# Patient Record
Sex: Female | Born: 1945 | Race: White | Hispanic: No | Marital: Married | Smoking: Never smoker
Health system: Southern US, Community
[De-identification: ages and names within clinical notes are randomized; demographics above are authoritative.]

## PROBLEM LIST (undated history)

## (undated) DIAGNOSIS — F329 Major depressive disorder, single episode, unspecified: Secondary | ICD-10-CM

## (undated) DIAGNOSIS — K219 Gastro-esophageal reflux disease without esophagitis: Secondary | ICD-10-CM

## (undated) DIAGNOSIS — F32A Depression, unspecified: Secondary | ICD-10-CM

## (undated) DIAGNOSIS — M8580 Other specified disorders of bone density and structure, unspecified site: Secondary | ICD-10-CM

## (undated) DIAGNOSIS — N289 Disorder of kidney and ureter, unspecified: Secondary | ICD-10-CM

## (undated) HISTORY — DX: Disorder of kidney and ureter, unspecified: N28.9

## (undated) HISTORY — DX: Major depressive disorder, single episode, unspecified: F32.9

## (undated) HISTORY — PX: ABDOMINAL HYSTERECTOMY: SHX81

## (undated) HISTORY — DX: Gastro-esophageal reflux disease without esophagitis: K21.9

## (undated) HISTORY — DX: Other specified disorders of bone density and structure, unspecified site: M85.80

## (undated) HISTORY — DX: Depression, unspecified: F32.A

---

## 2004-09-08 ENCOUNTER — Ambulatory Visit (HOSPITAL_COMMUNITY): Admission: RE | Admit: 2004-09-08 | Discharge: 2004-09-08 | Payer: Self-pay | Admitting: Gastroenterology

## 2011-09-29 ENCOUNTER — Other Ambulatory Visit (HOSPITAL_COMMUNITY)
Admission: RE | Admit: 2011-09-29 | Discharge: 2011-09-29 | Disposition: A | Discharge status: Home / Self Care | Payer: BC Managed Care – PPO | Source: Ambulatory Visit | Attending: Family Medicine | Admitting: Family Medicine

## 2011-09-29 DIAGNOSIS — Z01419 Encounter for gynecological examination (general) (routine) without abnormal findings: Secondary | ICD-10-CM | POA: Insufficient documentation

## 2012-08-21 ENCOUNTER — Ambulatory Visit (INDEPENDENT_AMBULATORY_CARE_PROVIDER_SITE_OTHER): Payer: BC Managed Care – PPO | Admitting: Internal Medicine

## 2012-08-21 VITALS — BP 102/70 | HR 79 | Temp 98.2°F | Resp 18 | Ht 62.0 in | Wt 160.0 lb

## 2012-08-21 DIAGNOSIS — J9801 Acute bronchospasm: Secondary | ICD-10-CM

## 2012-08-21 DIAGNOSIS — J40 Bronchitis, not specified as acute or chronic: Secondary | ICD-10-CM

## 2012-08-21 MED ORDER — ALBUTEROL SULFATE HFA 108 (90 BASE) MCG/ACT IN AERS: 2.0000 | INHALATION_SPRAY | Freq: Four times a day (QID) | RESPIRATORY_TRACT | Status: AC | PRN

## 2012-08-21 MED ORDER — AZITHROMYCIN 250 MG PO TABS: ORAL_TABLET | ORAL | Status: AC

## 2012-08-21 NOTE — Patient Instructions (Addendum)
Bronchospasm, Adult Bronchospasm means that there is a spasm or tightening of the airways going into the lungs. Because the airways go into a spasm and get smaller it makes breathing more difficult. For reasons not completely known, workings (functions) of the airways designed to protect the lungs become over active. This causes the airways to become more sensitive to:  Infection.  Weather.  Exercise.  Irritants.  Things that cause allergic reactions or allergies (allergens). Frequent coughing or respiratory episodes should be checked for the cause. This condition may be made worse by exercise. CAUSES  Inflammation is often the cause of this condition. Allergy, viral respiratory infections, or irritants in the air often cause this problem. Allergic reactions produce immediate and delayed responses. Late reactions may produce more serious inflammation. This may lead to increased reactivity of the airways. Sometimes this is inherited. Some common triggers are:  Allergies.  Infection commonly triggers attacks. Antibiotics are not helpful for viral infections and usually do not help with attacks of bronchospasm.  Exercise (running, etc.) can trigger an attack. Proper pre-exercise medications help most individuals participate in sports. Swimming is the least likely sport to cause problems.  Irritants (for example, pollution, cigarette smoke, strong odors, aerosol sprays, paint fumes, etc.) may trigger attacks. You cannot smoke and do not allow smoking in your home. This is absolutely necessary. Show this instruction to mates, relatives and significant others that may not agree with you.  Weather changes may cause lung problems but moving around trying to find an ideal climate does not seem to be overly helpful. Winds increase molds and pollens in the air. Rain refreshes the air by washing irritants out. Cold air may cause irritation.  Emotional problems do not cause lung problems but can  trigger attacks. SYMPTOMS  Wheezing is the most common symptom. Frequent coughing (with or without exercise and or crying) and repeated respiratory infections are all early warning signs of bronchospasm. Chest tightness and shortness of breath are other symptoms. DIAGNOSIS  Early hidden bronchospasm may go for long periods of time without being detected. This is especially true if wheezing cannot be detected by your caregiver. Lung (pulmonary) function studies may help with diagnosis in these cases. HOME CARE INSTRUCTIONS   It is necessary to remain calm during an attack. Try to relax and breathe more slowly. During this time medications may be given. If any breathing problems seem to be getting worse and are unresponsive to treatment seek immediate medical care.  If you have severe breathing difficulty or have had a life threatening attack it is probably a good idea for you to learn how to give adrenaline (epi-pen) or use an anaphylaxis kit. Your caregiver can help you with this. These are the same kits carried by people who have severe allergic reactions. This is especially important if you do not have readily accessible medical care.  With any severe breathing problems where epinephrine (adrenaline) has been given at home call 911 immediately as the delayed reaction may be even more severe. SEEK MEDICAL CARE IF:   There is wheezing and shortness of breath, even if medications are given to prevent attacks.  An oral temperature above 102 F (38.9 C) develops.  There are muscle aches, chest pain, or thickening of sputum.  The sputum changes from clear or white to yellow, green, gray, or bloody.  There are problems that may be related to the medicine you are given, such as a rash, itching, swelling, or trouble breathing. [place]  IF:   The usual medicines do not stop your wheezing, or there is increased coughing.  You have increased difficulty breathing. MAKE SURE YOU:     Understand these instructions.  Will watch your condition.  Will get help right away if you are not doing well or get worse. Document Released: 08/25/2003 Document Revised: 11/14/2011 Document Reviewed: 04/09/2008 [facility] Patient Information 2013 [place], [place].

## 2012-08-21 NOTE — Progress Notes (Signed)
  Subjective:    Patient ID: Diana Dickerson, female    DOB: March 08, 1946, 66 y.o.   MRN: [id number]  HPI Cough , hoarseness, wheezes. Has had this before this time of year. No sob, cp   Review of Systems     Objective:   Physical Exam Lungs upper airway wheezes No resp distress       Assessment & Plan:  Zpk/albuterol

## 2014-12-16 DIAGNOSIS — Z803 Family history of malignant neoplasm of breast: Secondary | ICD-10-CM | POA: Diagnosis not present

## 2014-12-16 DIAGNOSIS — Z1231 Encounter for screening mammogram for malignant neoplasm of breast: Secondary | ICD-10-CM | POA: Diagnosis not present

## 2015-05-06 DIAGNOSIS — K219 Gastro-esophageal reflux disease without esophagitis: Secondary | ICD-10-CM | POA: Diagnosis not present

## 2015-05-06 DIAGNOSIS — Z13220 Encounter for screening for lipoid disorders: Secondary | ICD-10-CM | POA: Diagnosis not present

## 2015-05-06 DIAGNOSIS — M859 Disorder of bone density and structure, unspecified: Secondary | ICD-10-CM | POA: Diagnosis not present

## 2015-05-06 DIAGNOSIS — F322 Major depressive disorder, single episode, severe without psychotic features: Secondary | ICD-10-CM | POA: Diagnosis not present

## 2015-05-06 DIAGNOSIS — Z0000 Encounter for general adult medical examination without abnormal findings: Secondary | ICD-10-CM | POA: Diagnosis not present

## 2015-05-06 DIAGNOSIS — Z23 Encounter for immunization: Secondary | ICD-10-CM | POA: Diagnosis not present

## 2015-05-06 DIAGNOSIS — D509 Iron deficiency anemia, unspecified: Secondary | ICD-10-CM | POA: Diagnosis not present

## 2015-05-06 DIAGNOSIS — Z1211 Encounter for screening for malignant neoplasm of colon: Secondary | ICD-10-CM | POA: Diagnosis not present

## 2015-06-09 DIAGNOSIS — S60222A Contusion of left hand, initial encounter: Secondary | ICD-10-CM | POA: Diagnosis not present

## 2015-06-09 DIAGNOSIS — S60212A Contusion of left wrist, initial encounter: Secondary | ICD-10-CM | POA: Diagnosis not present

## 2015-06-11 DIAGNOSIS — D509 Iron deficiency anemia, unspecified: Secondary | ICD-10-CM | POA: Diagnosis not present

## 2015-06-30 DIAGNOSIS — Z1211 Encounter for screening for malignant neoplasm of colon: Secondary | ICD-10-CM | POA: Diagnosis not present

## 2015-07-06 DIAGNOSIS — H5210 Myopia, unspecified eye: Secondary | ICD-10-CM | POA: Diagnosis not present

## 2015-07-06 DIAGNOSIS — H524 Presbyopia: Secondary | ICD-10-CM | POA: Diagnosis not present

## 2015-07-09 DIAGNOSIS — Z0100 Encounter for examination of eyes and vision without abnormal findings: Secondary | ICD-10-CM | POA: Diagnosis not present

## 2015-07-20 DIAGNOSIS — D509 Iron deficiency anemia, unspecified: Secondary | ICD-10-CM | POA: Diagnosis not present

## 2015-11-04 DIAGNOSIS — K219 Gastro-esophageal reflux disease without esophagitis: Secondary | ICD-10-CM | POA: Diagnosis not present

## 2015-11-04 DIAGNOSIS — Z1389 Encounter for screening for other disorder: Secondary | ICD-10-CM | POA: Diagnosis not present

## 2015-11-04 DIAGNOSIS — F324 Major depressive disorder, single episode, in partial remission: Secondary | ICD-10-CM | POA: Diagnosis not present

## 2015-11-04 DIAGNOSIS — D509 Iron deficiency anemia, unspecified: Secondary | ICD-10-CM | POA: Diagnosis not present

## 2015-12-28 DIAGNOSIS — Z1231 Encounter for screening mammogram for malignant neoplasm of breast: Secondary | ICD-10-CM | POA: Diagnosis not present

## 2015-12-28 DIAGNOSIS — Z803 Family history of malignant neoplasm of breast: Secondary | ICD-10-CM | POA: Diagnosis not present

## 2016-05-23 DIAGNOSIS — Z0000 Encounter for general adult medical examination without abnormal findings: Secondary | ICD-10-CM | POA: Diagnosis not present

## 2016-05-23 DIAGNOSIS — M21611 Bunion of right foot: Secondary | ICD-10-CM | POA: Diagnosis not present

## 2016-05-23 DIAGNOSIS — M8588 Other specified disorders of bone density and structure, other site: Secondary | ICD-10-CM | POA: Diagnosis not present

## 2016-05-23 DIAGNOSIS — K219 Gastro-esophageal reflux disease without esophagitis: Secondary | ICD-10-CM | POA: Diagnosis not present

## 2016-05-23 DIAGNOSIS — D509 Iron deficiency anemia, unspecified: Secondary | ICD-10-CM | POA: Diagnosis not present

## 2016-05-23 DIAGNOSIS — M21612 Bunion of left foot: Secondary | ICD-10-CM | POA: Diagnosis not present

## 2016-05-23 DIAGNOSIS — Z1389 Encounter for screening for other disorder: Secondary | ICD-10-CM | POA: Diagnosis not present

## 2016-05-23 DIAGNOSIS — F324 Major depressive disorder, single episode, in partial remission: Secondary | ICD-10-CM | POA: Diagnosis not present

## 2016-05-23 DIAGNOSIS — N289 Disorder of kidney and ureter, unspecified: Secondary | ICD-10-CM | POA: Diagnosis not present

## 2016-05-23 DIAGNOSIS — Z23 Encounter for immunization: Secondary | ICD-10-CM | POA: Diagnosis not present

## 2016-06-03 ENCOUNTER — Ambulatory Visit (INDEPENDENT_AMBULATORY_CARE_PROVIDER_SITE_OTHER): Payer: Commercial Managed Care - HMO | Admitting: Podiatry

## 2016-06-03 ENCOUNTER — Ambulatory Visit (INDEPENDENT_AMBULATORY_CARE_PROVIDER_SITE_OTHER): Payer: Commercial Managed Care - HMO

## 2016-06-03 ENCOUNTER — Encounter: Payer: Self-pay | Admitting: Podiatry

## 2016-06-03 VITALS — BP 115/61 | HR 60 | Resp 18

## 2016-06-03 DIAGNOSIS — M2040 Other hammer toe(s) (acquired), unspecified foot: Secondary | ICD-10-CM

## 2016-06-03 DIAGNOSIS — R52 Pain, unspecified: Secondary | ICD-10-CM

## 2016-06-03 DIAGNOSIS — M205X9 Other deformities of toe(s) (acquired), unspecified foot: Secondary | ICD-10-CM | POA: Diagnosis not present

## 2016-06-03 DIAGNOSIS — M2010 Hallux valgus (acquired), unspecified foot: Secondary | ICD-10-CM | POA: Diagnosis not present

## 2016-06-03 NOTE — Progress Notes (Signed)
   Subjective:    Patient ID: Diana SimmeringLinda C Dickerson, female    DOB: Mar 29, 1946, 70 y.o.   MRN: [id number]  HPI   70 year old female presents the office of consent the bunions to both of her feet as well as her second toes overlapping the left side worse in the right. This been ongoing for several years and she states he saw Dr. Ralene CorkSikora for this. She has tried numerous offloading pads for any relief of symptoms. She is inquiring further treatments and possible surgical intervention if the symptoms continue. She's tried changing her shoes well without any significant improvement. No other complaints at this time.  Review of Systems  All other systems reviewed and are negative.      Objective:   Physical Exam General: AAO x3, NAD  Dermatological: Skin is warm, dry and supple bilateral. Nails x 10 are well manicured; remaining integument appears unremarkable at this time. There are no open sores, no preulcerative lesions, no rash or signs of infection present.  Vascular: Dorsalis Pedis artery and Posterior Tibial artery pedal pulses are 2/4 bilateral with immedate capillary fill time.There is no pain with calf compression, swelling, warmth, erythema.   Neruologic: Grossly intact via light touch bilateral. Vibratory intact via tuning fork bilateral. Protective threshold with Semmes Wienstein monofilament intact to all pedal sites bilateral.  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 There is mild irritation DOS the first metatarsal head from irritation shoe gear. The second digit overlapping the hallux with the left side worse than the right. There is hammertoe contractures present outside and third toes bilaterally. MMT 5 side. Range of motion intact. No other areas of tenderness.   Gait: Unassisted, Nonantalgic.      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 HAV is present as well as hammertoe contractures present. On the left side the second third metatarsal appears to be slightly elongated. -Offloading has her further dispensed today to help hold the second toe down also dictate the toe down to help hold the toe. The toes are somewhat flexible. If symptoms continue discuss whether surgical intervention including bunionectomy as well as while osteotomies and hammertoe repair. We will continue him insurance quote at her request.  Ovid CurdMatthew Wagoner, DPM

## 2016-06-13 DIAGNOSIS — M8588 Other specified disorders of bone density and structure, other site: Secondary | ICD-10-CM | POA: Diagnosis not present

## 2016-06-16 DIAGNOSIS — L237 Allergic contact dermatitis due to plants, except food: Secondary | ICD-10-CM | POA: Diagnosis not present

## 2016-11-09 DIAGNOSIS — F324 Major depressive disorder, single episode, in partial remission: Secondary | ICD-10-CM | POA: Diagnosis not present

## 2016-11-09 DIAGNOSIS — K219 Gastro-esophageal reflux disease without esophagitis: Secondary | ICD-10-CM | POA: Diagnosis not present

## 2016-11-09 DIAGNOSIS — M25552 Pain in left hip: Secondary | ICD-10-CM | POA: Diagnosis not present

## 2016-12-29 DIAGNOSIS — Z1231 Encounter for screening mammogram for malignant neoplasm of breast: Secondary | ICD-10-CM | POA: Diagnosis not present

## 2016-12-29 DIAGNOSIS — Z803 Family history of malignant neoplasm of breast: Secondary | ICD-10-CM | POA: Diagnosis not present

## 2017-02-15 DIAGNOSIS — H02839 Dermatochalasis of unspecified eye, unspecified eyelid: Secondary | ICD-10-CM | POA: Diagnosis not present

## 2017-02-15 DIAGNOSIS — H2513 Age-related nuclear cataract, bilateral: Secondary | ICD-10-CM | POA: Diagnosis not present

## 2017-02-15 DIAGNOSIS — H35363 Drusen (degenerative) of macula, bilateral: Secondary | ICD-10-CM | POA: Diagnosis not present

## 2017-02-15 DIAGNOSIS — H25013 Cortical age-related cataract, bilateral: Secondary | ICD-10-CM | POA: Diagnosis not present

## 2017-03-20 ENCOUNTER — Other Ambulatory Visit: Payer: Self-pay | Admitting: Family Medicine

## 2017-03-20 ENCOUNTER — Ambulatory Visit
Admission: RE | Admit: 2017-03-20 | Discharge: 2017-03-20 | Disposition: A | Discharge status: Home / Self Care | Payer: Medicare HMO | Source: Ambulatory Visit | Attending: Family Medicine | Admitting: Family Medicine

## 2017-03-20 DIAGNOSIS — M25552 Pain in left hip: Secondary | ICD-10-CM

## 2017-03-20 DIAGNOSIS — F324 Major depressive disorder, single episode, in partial remission: Secondary | ICD-10-CM | POA: Diagnosis not present

## 2017-05-24 ENCOUNTER — Other Ambulatory Visit (HOSPITAL_COMMUNITY)
Admission: RE | Admit: 2017-05-24 | Discharge: 2017-05-24 | Disposition: A | Discharge status: Home / Self Care | Payer: Medicare HMO | Source: Ambulatory Visit | Attending: Family Medicine | Admitting: Family Medicine

## 2017-05-24 ENCOUNTER — Other Ambulatory Visit: Payer: Self-pay | Admitting: Family Medicine

## 2017-05-24 DIAGNOSIS — Z0000 Encounter for general adult medical examination without abnormal findings: Secondary | ICD-10-CM | POA: Insufficient documentation

## 2017-05-24 DIAGNOSIS — Z1159 Encounter for screening for other viral diseases: Secondary | ICD-10-CM | POA: Diagnosis not present

## 2017-05-24 DIAGNOSIS — Z124 Encounter for screening for malignant neoplasm of cervix: Secondary | ICD-10-CM | POA: Diagnosis not present

## 2017-05-24 DIAGNOSIS — Z1389 Encounter for screening for other disorder: Secondary | ICD-10-CM | POA: Diagnosis not present

## 2017-05-24 DIAGNOSIS — M21612 Bunion of left foot: Secondary | ICD-10-CM | POA: Diagnosis not present

## 2017-05-24 DIAGNOSIS — Z23 Encounter for immunization: Secondary | ICD-10-CM | POA: Diagnosis not present

## 2017-05-24 DIAGNOSIS — M8588 Other specified disorders of bone density and structure, other site: Secondary | ICD-10-CM | POA: Diagnosis not present

## 2017-05-24 DIAGNOSIS — N289 Disorder of kidney and ureter, unspecified: Secondary | ICD-10-CM | POA: Diagnosis not present

## 2017-05-24 DIAGNOSIS — K219 Gastro-esophageal reflux disease without esophagitis: Secondary | ICD-10-CM | POA: Diagnosis not present

## 2017-05-24 DIAGNOSIS — D509 Iron deficiency anemia, unspecified: Secondary | ICD-10-CM | POA: Diagnosis not present

## 2017-05-30 LAB — CYTOLOGY - PAP
Diagnosis: UNDETERMINED — AB
HPV: NOT DETECTED

## 2017-08-31 DIAGNOSIS — J0100 Acute maxillary sinusitis, unspecified: Secondary | ICD-10-CM | POA: Diagnosis not present

## 2017-11-29 DIAGNOSIS — M21612 Bunion of left foot: Secondary | ICD-10-CM | POA: Diagnosis not present

## 2017-11-29 DIAGNOSIS — M21611 Bunion of right foot: Secondary | ICD-10-CM | POA: Diagnosis not present

## 2017-11-29 DIAGNOSIS — N289 Disorder of kidney and ureter, unspecified: Secondary | ICD-10-CM | POA: Diagnosis not present

## 2017-11-29 DIAGNOSIS — K219 Gastro-esophageal reflux disease without esophagitis: Secondary | ICD-10-CM | POA: Diagnosis not present

## 2017-11-29 DIAGNOSIS — F324 Major depressive disorder, single episode, in partial remission: Secondary | ICD-10-CM | POA: Diagnosis not present

## 2017-11-29 DIAGNOSIS — M8588 Other specified disorders of bone density and structure, other site: Secondary | ICD-10-CM | POA: Diagnosis not present

## 2017-11-29 DIAGNOSIS — N183 Chronic kidney disease, stage 3 (moderate): Secondary | ICD-10-CM | POA: Diagnosis not present

## 2017-11-29 DIAGNOSIS — D509 Iron deficiency anemia, unspecified: Secondary | ICD-10-CM | POA: Diagnosis not present

## 2017-11-29 DIAGNOSIS — Z6830 Body mass index (BMI) 30.0-30.9, adult: Secondary | ICD-10-CM | POA: Diagnosis not present

## 2018-01-02 DIAGNOSIS — Z1231 Encounter for screening mammogram for malignant neoplasm of breast: Secondary | ICD-10-CM | POA: Diagnosis not present

## 2018-01-02 DIAGNOSIS — Z803 Family history of malignant neoplasm of breast: Secondary | ICD-10-CM | POA: Diagnosis not present

## 2018-02-20 DIAGNOSIS — H2513 Age-related nuclear cataract, bilateral: Secondary | ICD-10-CM | POA: Diagnosis not present

## 2018-02-20 DIAGNOSIS — H25013 Cortical age-related cataract, bilateral: Secondary | ICD-10-CM | POA: Diagnosis not present

## 2018-02-20 DIAGNOSIS — H35363 Drusen (degenerative) of macula, bilateral: Secondary | ICD-10-CM | POA: Diagnosis not present

## 2018-02-20 DIAGNOSIS — H43811 Vitreous degeneration, right eye: Secondary | ICD-10-CM | POA: Diagnosis not present

## 2018-06-05 ENCOUNTER — Other Ambulatory Visit: Payer: Self-pay | Admitting: Family Medicine

## 2018-06-05 ENCOUNTER — Ambulatory Visit
Admission: RE | Admit: 2018-06-05 | Discharge: 2018-06-05 | Disposition: A | Discharge status: Home / Self Care | Payer: Medicare HMO | Source: Ambulatory Visit | Attending: Family Medicine | Admitting: Family Medicine

## 2018-06-05 DIAGNOSIS — M8588 Other specified disorders of bone density and structure, other site: Secondary | ICD-10-CM | POA: Diagnosis not present

## 2018-06-05 DIAGNOSIS — M79644 Pain in right finger(s): Secondary | ICD-10-CM

## 2018-06-05 DIAGNOSIS — N289 Disorder of kidney and ureter, unspecified: Secondary | ICD-10-CM | POA: Diagnosis not present

## 2018-06-05 DIAGNOSIS — K219 Gastro-esophageal reflux disease without esophagitis: Secondary | ICD-10-CM | POA: Diagnosis not present

## 2018-06-05 DIAGNOSIS — N183 Chronic kidney disease, stage 3 (moderate): Secondary | ICD-10-CM | POA: Diagnosis not present

## 2018-06-05 DIAGNOSIS — M21612 Bunion of left foot: Secondary | ICD-10-CM | POA: Diagnosis not present

## 2018-06-05 DIAGNOSIS — Z0000 Encounter for general adult medical examination without abnormal findings: Secondary | ICD-10-CM | POA: Diagnosis not present

## 2018-06-05 DIAGNOSIS — M1811 Unilateral primary osteoarthritis of first carpometacarpal joint, right hand: Secondary | ICD-10-CM | POA: Diagnosis not present

## 2018-06-05 DIAGNOSIS — M21611 Bunion of right foot: Secondary | ICD-10-CM | POA: Diagnosis not present

## 2018-06-05 DIAGNOSIS — D509 Iron deficiency anemia, unspecified: Secondary | ICD-10-CM | POA: Diagnosis not present

## 2018-06-05 DIAGNOSIS — Z23 Encounter for immunization: Secondary | ICD-10-CM | POA: Diagnosis not present

## 2019-07-07 IMAGING — CR DG FINGER THUMB 2+V*R*
3 series · 3 of 3 positions shown · non-contrast
Comparison: None

CLINICAL DATA: Increasing RIGHT thumb pain, 6 months of pain at the
base of the RIGHT thumb at CMC joint

EXAM:
RIGHT THUMB 2+V

[x finger pa right]
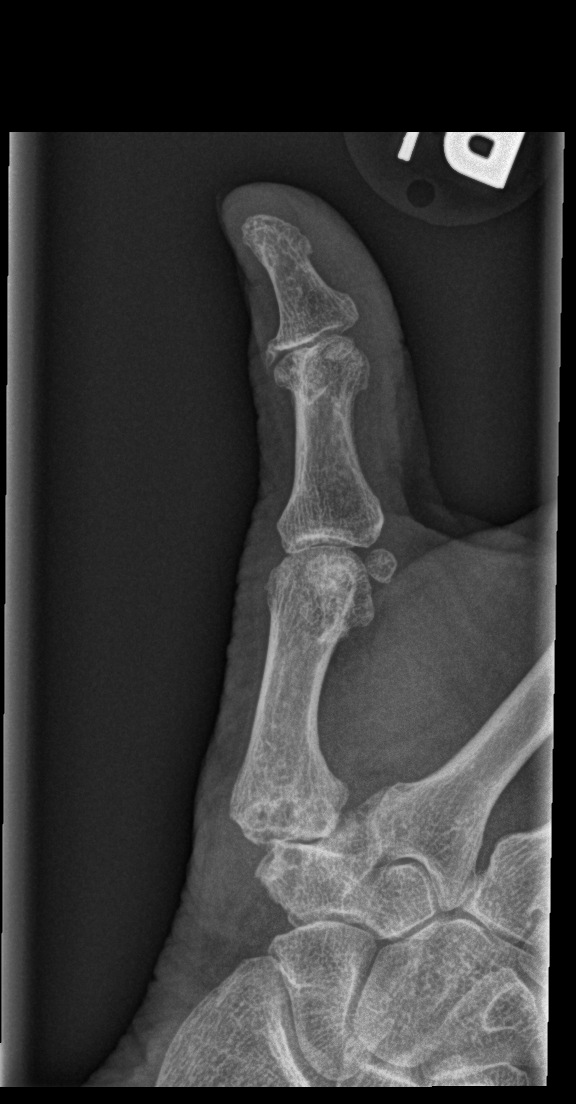

[x finger obl right]
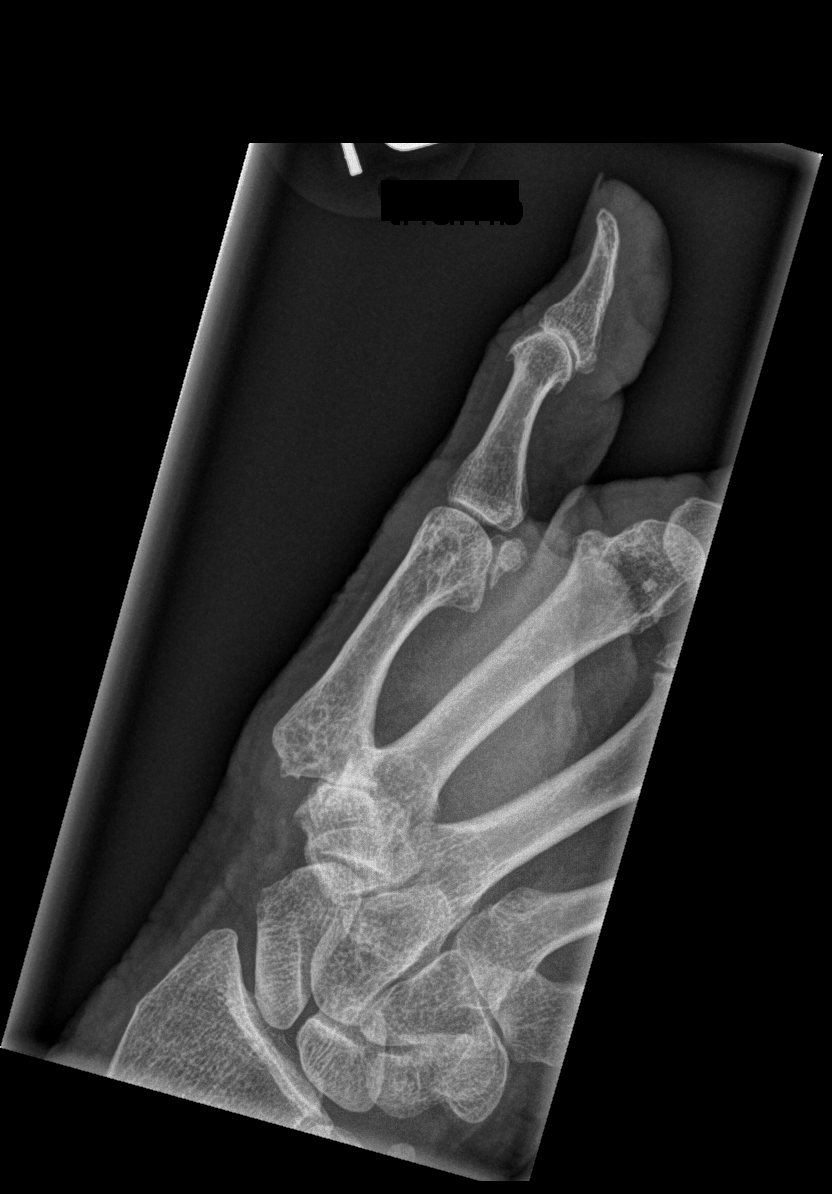

[x finger lat right]
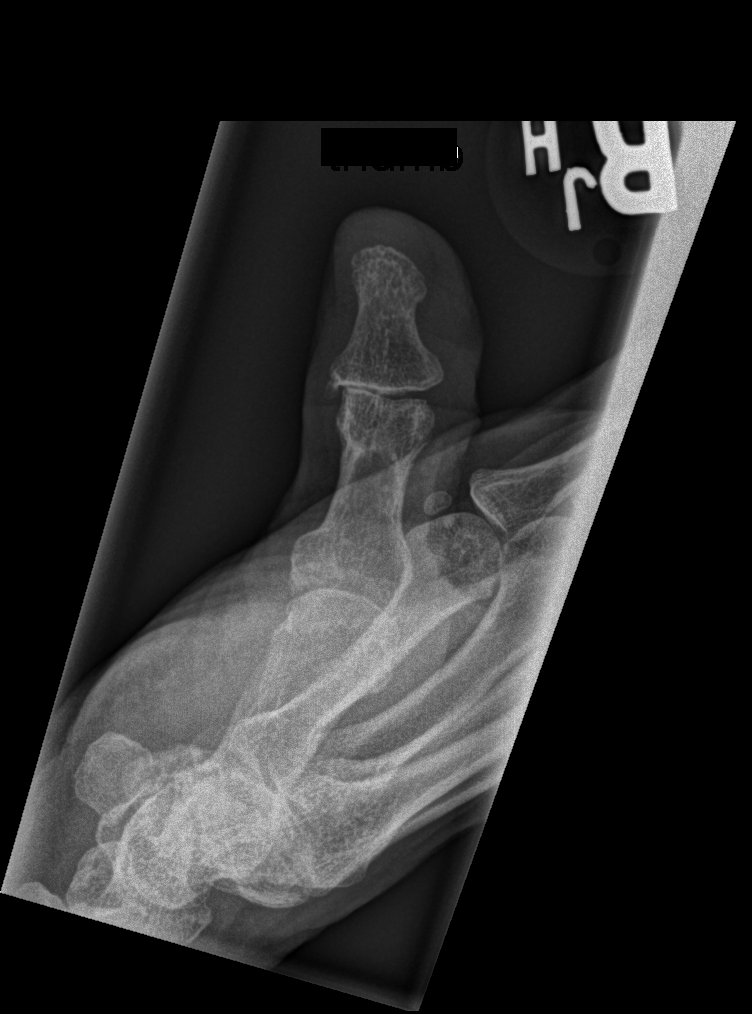

[3 of 3 positions shown; findings below may reference images not displayed]

FINDINGS: Osseous demineralization.

Mild degenerative changes at the IP joint of the RIGHT thumb with
joint space narrowing and spur formation.

More advanced degenerative changes are seen at the first CMC joint
with joint space narrowing, minimal spur formation, subchondral cyst
formation at the base of the first metacarpal, and slight radial
subluxation of the first metacarpal.

No acute fracture, dislocation, or bone destruction.
IMPRESSION: Degenerative changes RIGHT thumb at first CMC joint and to a lesser
degree at IP joint.

## 2020-11-25 DIAGNOSIS — H35363 Drusen (degenerative) of macula, bilateral: Secondary | ICD-10-CM | POA: Diagnosis not present

## 2020-11-25 DIAGNOSIS — H26493 Other secondary cataract, bilateral: Secondary | ICD-10-CM | POA: Diagnosis not present

## 2020-11-25 DIAGNOSIS — H04123 Dry eye syndrome of bilateral lacrimal glands: Secondary | ICD-10-CM | POA: Diagnosis not present

## 2020-11-25 DIAGNOSIS — Z961 Presence of intraocular lens: Secondary | ICD-10-CM | POA: Diagnosis not present

## 2021-04-14 DIAGNOSIS — Z1231 Encounter for screening mammogram for malignant neoplasm of breast: Secondary | ICD-10-CM | POA: Diagnosis not present

## 2021-05-04 DIAGNOSIS — Z23 Encounter for immunization: Secondary | ICD-10-CM | POA: Diagnosis not present

## 2021-06-30 DIAGNOSIS — D509 Iron deficiency anemia, unspecified: Secondary | ICD-10-CM | POA: Diagnosis not present

## 2021-06-30 DIAGNOSIS — M21611 Bunion of right foot: Secondary | ICD-10-CM | POA: Diagnosis not present

## 2021-06-30 DIAGNOSIS — N1831 Chronic kidney disease, stage 3a: Secondary | ICD-10-CM | POA: Diagnosis not present

## 2021-06-30 DIAGNOSIS — K219 Gastro-esophageal reflux disease without esophagitis: Secondary | ICD-10-CM | POA: Diagnosis not present

## 2021-06-30 DIAGNOSIS — Z0000 Encounter for general adult medical examination without abnormal findings: Secondary | ICD-10-CM | POA: Diagnosis not present

## 2021-06-30 DIAGNOSIS — F3341 Major depressive disorder, recurrent, in partial remission: Secondary | ICD-10-CM | POA: Diagnosis not present

## 2021-06-30 DIAGNOSIS — E2839 Other primary ovarian failure: Secondary | ICD-10-CM | POA: Diagnosis not present

## 2021-06-30 DIAGNOSIS — M21612 Bunion of left foot: Secondary | ICD-10-CM | POA: Diagnosis not present

## 2021-06-30 DIAGNOSIS — Z136 Encounter for screening for cardiovascular disorders: Secondary | ICD-10-CM | POA: Diagnosis not present

## 2021-06-30 DIAGNOSIS — M8588 Other specified disorders of bone density and structure, other site: Secondary | ICD-10-CM | POA: Diagnosis not present

## 2021-06-30 DIAGNOSIS — M79644 Pain in right finger(s): Secondary | ICD-10-CM | POA: Diagnosis not present

## 2021-06-30 DIAGNOSIS — Z23 Encounter for immunization: Secondary | ICD-10-CM | POA: Diagnosis not present

## 2021-08-16 DIAGNOSIS — M8589 Other specified disorders of bone density and structure, multiple sites: Secondary | ICD-10-CM | POA: Diagnosis not present

## 2021-12-04 DIAGNOSIS — H26493 Other secondary cataract, bilateral: Secondary | ICD-10-CM | POA: Diagnosis not present

## 2021-12-04 DIAGNOSIS — H35363 Drusen (degenerative) of macula, bilateral: Secondary | ICD-10-CM | POA: Diagnosis not present

## 2021-12-04 DIAGNOSIS — G43B0 Ophthalmoplegic migraine, not intractable: Secondary | ICD-10-CM | POA: Diagnosis not present

## 2021-12-04 DIAGNOSIS — H40013 Open angle with borderline findings, low risk, bilateral: Secondary | ICD-10-CM | POA: Diagnosis not present

## 2022-04-20 DIAGNOSIS — Z1231 Encounter for screening mammogram for malignant neoplasm of breast: Secondary | ICD-10-CM | POA: Diagnosis not present

## 2022-06-23 DIAGNOSIS — Z23 Encounter for immunization: Secondary | ICD-10-CM | POA: Diagnosis not present

## 2022-07-25 DIAGNOSIS — M79644 Pain in right finger(s): Secondary | ICD-10-CM | POA: Diagnosis not present

## 2022-07-25 DIAGNOSIS — Z1331 Encounter for screening for depression: Secondary | ICD-10-CM | POA: Diagnosis not present

## 2022-07-25 DIAGNOSIS — F3341 Major depressive disorder, recurrent, in partial remission: Secondary | ICD-10-CM | POA: Diagnosis not present

## 2022-07-25 DIAGNOSIS — M21611 Bunion of right foot: Secondary | ICD-10-CM | POA: Diagnosis not present

## 2022-07-25 DIAGNOSIS — Z23 Encounter for immunization: Secondary | ICD-10-CM | POA: Diagnosis not present

## 2022-07-25 DIAGNOSIS — Z136 Encounter for screening for cardiovascular disorders: Secondary | ICD-10-CM | POA: Diagnosis not present

## 2022-07-25 DIAGNOSIS — K219 Gastro-esophageal reflux disease without esophagitis: Secondary | ICD-10-CM | POA: Diagnosis not present

## 2022-07-25 DIAGNOSIS — D509 Iron deficiency anemia, unspecified: Secondary | ICD-10-CM | POA: Diagnosis not present

## 2022-07-25 DIAGNOSIS — M21612 Bunion of left foot: Secondary | ICD-10-CM | POA: Diagnosis not present

## 2022-07-25 DIAGNOSIS — Z0000 Encounter for general adult medical examination without abnormal findings: Secondary | ICD-10-CM | POA: Diagnosis not present

## 2022-07-25 DIAGNOSIS — N1831 Chronic kidney disease, stage 3a: Secondary | ICD-10-CM | POA: Diagnosis not present

## 2022-07-25 DIAGNOSIS — Z13220 Encounter for screening for lipoid disorders: Secondary | ICD-10-CM | POA: Diagnosis not present

## 2022-11-21 DIAGNOSIS — K644 Residual hemorrhoidal skin tags: Secondary | ICD-10-CM | POA: Diagnosis not present

## 2022-11-21 DIAGNOSIS — E2839 Other primary ovarian failure: Secondary | ICD-10-CM | POA: Diagnosis not present

## 2022-11-21 DIAGNOSIS — N952 Postmenopausal atrophic vaginitis: Secondary | ICD-10-CM | POA: Diagnosis not present

## 2022-12-12 DIAGNOSIS — H40013 Open angle with borderline findings, low risk, bilateral: Secondary | ICD-10-CM | POA: Diagnosis not present

## 2022-12-12 DIAGNOSIS — H35363 Drusen (degenerative) of macula, bilateral: Secondary | ICD-10-CM | POA: Diagnosis not present

## 2022-12-12 DIAGNOSIS — H26493 Other secondary cataract, bilateral: Secondary | ICD-10-CM | POA: Diagnosis not present

## 2022-12-12 DIAGNOSIS — H35033 Hypertensive retinopathy, bilateral: Secondary | ICD-10-CM | POA: Diagnosis not present

## 2023-04-26 DIAGNOSIS — Z1231 Encounter for screening mammogram for malignant neoplasm of breast: Secondary | ICD-10-CM | POA: Diagnosis not present

## 2023-05-10 DIAGNOSIS — F329 Major depressive disorder, single episode, unspecified: Secondary | ICD-10-CM | POA: Diagnosis not present

## 2023-05-10 DIAGNOSIS — I8393 Asymptomatic varicose veins of bilateral lower extremities: Secondary | ICD-10-CM | POA: Diagnosis not present

## 2023-05-10 DIAGNOSIS — R202 Paresthesia of skin: Secondary | ICD-10-CM | POA: Diagnosis not present

## 2023-05-10 DIAGNOSIS — D509 Iron deficiency anemia, unspecified: Secondary | ICD-10-CM | POA: Diagnosis not present

## 2023-05-10 DIAGNOSIS — R5383 Other fatigue: Secondary | ICD-10-CM | POA: Diagnosis not present

## 2023-05-23 DIAGNOSIS — I83893 Varicose veins of bilateral lower extremities with other complications: Secondary | ICD-10-CM | POA: Diagnosis not present

## 2023-05-23 DIAGNOSIS — I872 Venous insufficiency (chronic) (peripheral): Secondary | ICD-10-CM | POA: Diagnosis not present

## 2023-05-23 DIAGNOSIS — M79662 Pain in left lower leg: Secondary | ICD-10-CM | POA: Diagnosis not present

## 2023-05-23 DIAGNOSIS — M79661 Pain in right lower leg: Secondary | ICD-10-CM | POA: Diagnosis not present

## 2023-05-25 DIAGNOSIS — I83893 Varicose veins of bilateral lower extremities with other complications: Secondary | ICD-10-CM | POA: Diagnosis not present

## 2023-05-31 DIAGNOSIS — I83891 Varicose veins of right lower extremities with other complications: Secondary | ICD-10-CM | POA: Diagnosis not present

## 2023-06-02 DIAGNOSIS — I83891 Varicose veins of right lower extremities with other complications: Secondary | ICD-10-CM | POA: Diagnosis not present

## 2023-06-02 DIAGNOSIS — I83811 Varicose veins of right lower extremities with pain: Secondary | ICD-10-CM | POA: Diagnosis not present

## 2023-06-02 DIAGNOSIS — M7989 Other specified soft tissue disorders: Secondary | ICD-10-CM | POA: Diagnosis not present

## 2023-06-19 DIAGNOSIS — I8311 Varicose veins of right lower extremity with inflammation: Secondary | ICD-10-CM | POA: Diagnosis not present

## 2023-06-19 DIAGNOSIS — I872 Venous insufficiency (chronic) (peripheral): Secondary | ICD-10-CM | POA: Diagnosis not present

## 2023-06-30 DIAGNOSIS — Z23 Encounter for immunization: Secondary | ICD-10-CM | POA: Diagnosis not present

## 2023-08-02 DIAGNOSIS — F3341 Major depressive disorder, recurrent, in partial remission: Secondary | ICD-10-CM | POA: Diagnosis not present

## 2023-08-02 DIAGNOSIS — Z23 Encounter for immunization: Secondary | ICD-10-CM | POA: Diagnosis not present

## 2023-08-02 DIAGNOSIS — D509 Iron deficiency anemia, unspecified: Secondary | ICD-10-CM | POA: Diagnosis not present

## 2023-08-02 DIAGNOSIS — Z0000 Encounter for general adult medical examination without abnormal findings: Secondary | ICD-10-CM | POA: Diagnosis not present

## 2023-08-02 DIAGNOSIS — K5641 Fecal impaction: Secondary | ICD-10-CM | POA: Diagnosis not present

## 2023-08-02 DIAGNOSIS — N1831 Chronic kidney disease, stage 3a: Secondary | ICD-10-CM | POA: Diagnosis not present

## 2023-08-02 DIAGNOSIS — M8588 Other specified disorders of bone density and structure, other site: Secondary | ICD-10-CM | POA: Diagnosis not present

## 2023-08-02 DIAGNOSIS — K219 Gastro-esophageal reflux disease without esophagitis: Secondary | ICD-10-CM | POA: Diagnosis not present

## 2023-08-23 DIAGNOSIS — K5909 Other constipation: Secondary | ICD-10-CM | POA: Diagnosis not present

## 2023-08-23 DIAGNOSIS — Z8719 Personal history of other diseases of the digestive system: Secondary | ICD-10-CM | POA: Diagnosis not present

## 2023-08-31 ENCOUNTER — Ambulatory Visit
Admission: RE | Admit: 2023-08-31 | Discharge: 2023-08-31 | Disposition: A | Discharge status: Home / Self Care | Payer: Medicare Other | Source: Ambulatory Visit | Attending: Gastroenterology | Admitting: Gastroenterology

## 2023-08-31 ENCOUNTER — Other Ambulatory Visit: Payer: Self-pay | Admitting: Gastroenterology

## 2023-08-31 DIAGNOSIS — Z8719 Personal history of other diseases of the digestive system: Secondary | ICD-10-CM

## 2023-08-31 DIAGNOSIS — K5641 Fecal impaction: Secondary | ICD-10-CM | POA: Diagnosis not present

## 2023-09-14 DIAGNOSIS — M8588 Other specified disorders of bone density and structure, other site: Secondary | ICD-10-CM | POA: Diagnosis not present

## 2023-10-02 DIAGNOSIS — I83893 Varicose veins of bilateral lower extremities with other complications: Secondary | ICD-10-CM | POA: Diagnosis not present

## 2023-10-02 DIAGNOSIS — I872 Venous insufficiency (chronic) (peripheral): Secondary | ICD-10-CM | POA: Diagnosis not present

## 2023-10-09 DIAGNOSIS — R159 Full incontinence of feces: Secondary | ICD-10-CM | POA: Diagnosis not present

## 2023-10-09 DIAGNOSIS — D120 Benign neoplasm of cecum: Secondary | ICD-10-CM | POA: Diagnosis not present

## 2023-10-09 DIAGNOSIS — D122 Benign neoplasm of ascending colon: Secondary | ICD-10-CM | POA: Diagnosis not present

## 2023-10-09 DIAGNOSIS — D127 Benign neoplasm of rectosigmoid junction: Secondary | ICD-10-CM | POA: Diagnosis not present

## 2023-10-09 DIAGNOSIS — K5730 Diverticulosis of large intestine without perforation or abscess without bleeding: Secondary | ICD-10-CM | POA: Diagnosis not present

## 2023-10-09 DIAGNOSIS — R194 Change in bowel habit: Secondary | ICD-10-CM | POA: Diagnosis not present

## 2023-10-09 DIAGNOSIS — K635 Polyp of colon: Secondary | ICD-10-CM | POA: Diagnosis not present

## 2023-10-11 DIAGNOSIS — K635 Polyp of colon: Secondary | ICD-10-CM | POA: Diagnosis not present

## 2023-10-11 DIAGNOSIS — D120 Benign neoplasm of cecum: Secondary | ICD-10-CM | POA: Diagnosis not present

## 2023-10-11 DIAGNOSIS — D127 Benign neoplasm of rectosigmoid junction: Secondary | ICD-10-CM | POA: Diagnosis not present

## 2023-10-11 DIAGNOSIS — D122 Benign neoplasm of ascending colon: Secondary | ICD-10-CM | POA: Diagnosis not present

## 2023-11-28 DIAGNOSIS — N1831 Chronic kidney disease, stage 3a: Secondary | ICD-10-CM | POA: Diagnosis not present

## 2023-12-04 DIAGNOSIS — F3341 Major depressive disorder, recurrent, in partial remission: Secondary | ICD-10-CM | POA: Diagnosis not present

## 2023-12-04 DIAGNOSIS — N1831 Chronic kidney disease, stage 3a: Secondary | ICD-10-CM | POA: Diagnosis not present

## 2023-12-04 DIAGNOSIS — F329 Major depressive disorder, single episode, unspecified: Secondary | ICD-10-CM | POA: Diagnosis not present

## 2023-12-08 DIAGNOSIS — J019 Acute sinusitis, unspecified: Secondary | ICD-10-CM | POA: Diagnosis not present

## 2023-12-18 DIAGNOSIS — H35363 Drusen (degenerative) of macula, bilateral: Secondary | ICD-10-CM | POA: Diagnosis not present

## 2023-12-18 DIAGNOSIS — H35033 Hypertensive retinopathy, bilateral: Secondary | ICD-10-CM | POA: Diagnosis not present

## 2023-12-18 DIAGNOSIS — H40013 Open angle with borderline findings, low risk, bilateral: Secondary | ICD-10-CM | POA: Diagnosis not present

## 2023-12-18 DIAGNOSIS — H26493 Other secondary cataract, bilateral: Secondary | ICD-10-CM | POA: Diagnosis not present

## 2023-12-27 DIAGNOSIS — N1831 Chronic kidney disease, stage 3a: Secondary | ICD-10-CM | POA: Diagnosis not present

## 2024-01-03 DIAGNOSIS — F3341 Major depressive disorder, recurrent, in partial remission: Secondary | ICD-10-CM | POA: Diagnosis not present

## 2024-01-03 DIAGNOSIS — N1831 Chronic kidney disease, stage 3a: Secondary | ICD-10-CM | POA: Diagnosis not present

## 2024-01-03 DIAGNOSIS — F329 Major depressive disorder, single episode, unspecified: Secondary | ICD-10-CM | POA: Diagnosis not present

## 2024-01-26 DIAGNOSIS — N1831 Chronic kidney disease, stage 3a: Secondary | ICD-10-CM | POA: Diagnosis not present

## 2024-02-03 DIAGNOSIS — F329 Major depressive disorder, single episode, unspecified: Secondary | ICD-10-CM | POA: Diagnosis not present

## 2024-02-03 DIAGNOSIS — F3341 Major depressive disorder, recurrent, in partial remission: Secondary | ICD-10-CM | POA: Diagnosis not present

## 2024-02-03 DIAGNOSIS — N1831 Chronic kidney disease, stage 3a: Secondary | ICD-10-CM | POA: Diagnosis not present

## 2024-02-25 DIAGNOSIS — N1831 Chronic kidney disease, stage 3a: Secondary | ICD-10-CM | POA: Diagnosis not present

## 2024-03-04 DIAGNOSIS — F329 Major depressive disorder, single episode, unspecified: Secondary | ICD-10-CM | POA: Diagnosis not present

## 2024-03-04 DIAGNOSIS — N1831 Chronic kidney disease, stage 3a: Secondary | ICD-10-CM | POA: Diagnosis not present

## 2024-03-04 DIAGNOSIS — F3341 Major depressive disorder, recurrent, in partial remission: Secondary | ICD-10-CM | POA: Diagnosis not present

## 2024-03-26 DIAGNOSIS — N1831 Chronic kidney disease, stage 3a: Secondary | ICD-10-CM | POA: Diagnosis not present

## 2024-04-04 DIAGNOSIS — F329 Major depressive disorder, single episode, unspecified: Secondary | ICD-10-CM | POA: Diagnosis not present

## 2024-04-04 DIAGNOSIS — N1831 Chronic kidney disease, stage 3a: Secondary | ICD-10-CM | POA: Diagnosis not present

## 2024-04-04 DIAGNOSIS — F3341 Major depressive disorder, recurrent, in partial remission: Secondary | ICD-10-CM | POA: Diagnosis not present

## 2024-05-01 DIAGNOSIS — Z1231 Encounter for screening mammogram for malignant neoplasm of breast: Secondary | ICD-10-CM | POA: Diagnosis not present

## 2024-05-05 DIAGNOSIS — N1831 Chronic kidney disease, stage 3a: Secondary | ICD-10-CM | POA: Diagnosis not present

## 2024-05-05 DIAGNOSIS — F329 Major depressive disorder, single episode, unspecified: Secondary | ICD-10-CM | POA: Diagnosis not present

## 2024-05-05 DIAGNOSIS — F3341 Major depressive disorder, recurrent, in partial remission: Secondary | ICD-10-CM | POA: Diagnosis not present

## 2024-06-04 DIAGNOSIS — F3341 Major depressive disorder, recurrent, in partial remission: Secondary | ICD-10-CM | POA: Diagnosis not present

## 2024-06-04 DIAGNOSIS — N1831 Chronic kidney disease, stage 3a: Secondary | ICD-10-CM | POA: Diagnosis not present

## 2024-06-04 DIAGNOSIS — F329 Major depressive disorder, single episode, unspecified: Secondary | ICD-10-CM | POA: Diagnosis not present

## 2024-07-05 DIAGNOSIS — N1831 Chronic kidney disease, stage 3a: Secondary | ICD-10-CM | POA: Diagnosis not present

## 2024-07-05 DIAGNOSIS — F3341 Major depressive disorder, recurrent, in partial remission: Secondary | ICD-10-CM | POA: Diagnosis not present

## 2024-07-05 DIAGNOSIS — F329 Major depressive disorder, single episode, unspecified: Secondary | ICD-10-CM | POA: Diagnosis not present

## 2024-07-16 DIAGNOSIS — Z23 Encounter for immunization: Secondary | ICD-10-CM | POA: Diagnosis not present

## 2024-08-04 DIAGNOSIS — F329 Major depressive disorder, single episode, unspecified: Secondary | ICD-10-CM | POA: Diagnosis not present

## 2024-08-04 DIAGNOSIS — F3341 Major depressive disorder, recurrent, in partial remission: Secondary | ICD-10-CM | POA: Diagnosis not present

## 2024-08-04 DIAGNOSIS — N1831 Chronic kidney disease, stage 3a: Secondary | ICD-10-CM | POA: Diagnosis not present

## 2024-08-12 DIAGNOSIS — K649 Unspecified hemorrhoids: Secondary | ICD-10-CM | POA: Diagnosis not present

## 2024-08-12 DIAGNOSIS — Z8719 Personal history of other diseases of the digestive system: Secondary | ICD-10-CM | POA: Diagnosis not present

## 2024-08-12 DIAGNOSIS — K5909 Other constipation: Secondary | ICD-10-CM | POA: Diagnosis not present

## 2024-08-12 DIAGNOSIS — K219 Gastro-esophageal reflux disease without esophagitis: Secondary | ICD-10-CM | POA: Diagnosis not present
